# Patient Record
Sex: Male | Born: 1985 | Race: White | Hispanic: No | Marital: Single | Smoking: Current every day smoker
Health system: Southern US, Community
[De-identification: ages and names within clinical notes are randomized; demographics above are authoritative.]

---

## 2019-06-09 ENCOUNTER — Encounter: Payer: Self-pay | Admitting: Emergency Medicine

## 2019-06-09 ENCOUNTER — Emergency Department: Payer: Self-pay

## 2019-06-09 ENCOUNTER — Emergency Department
Admission: EM | Admit: 2019-06-09 | Discharge: 2019-06-09 | Disposition: A | Discharge status: Home / Self Care | Payer: Self-pay | Attending: Emergency Medicine | Admitting: Emergency Medicine

## 2019-06-09 ENCOUNTER — Other Ambulatory Visit: Payer: Self-pay

## 2019-06-09 DIAGNOSIS — R079 Chest pain, unspecified: Secondary | ICD-10-CM | POA: Insufficient documentation

## 2019-06-09 DIAGNOSIS — R112 Nausea with vomiting, unspecified: Secondary | ICD-10-CM | POA: Insufficient documentation

## 2019-06-09 DIAGNOSIS — R197 Diarrhea, unspecified: Secondary | ICD-10-CM | POA: Insufficient documentation

## 2019-06-09 DIAGNOSIS — F17200 Nicotine dependence, unspecified, uncomplicated: Secondary | ICD-10-CM | POA: Insufficient documentation

## 2019-06-09 LAB — COMPREHENSIVE METABOLIC PANEL
ALT: 174 U/L — ABNORMAL HIGH (ref 0–44)
AST: 158 U/L — ABNORMAL HIGH (ref 15–41)
Albumin: 4.5 g/dL (ref 3.5–5.0)
Alkaline Phosphatase: 106 U/L (ref 38–126)
Anion gap: 14 (ref 5–15)
BUN: 12 mg/dL (ref 6–20)
CO2: 22 mmol/L (ref 22–32)
Calcium: 9.4 mg/dL (ref 8.9–10.3)
Chloride: 101 mmol/L (ref 98–111)
Creatinine, Ser: 0.8 mg/dL (ref 0.61–1.24)
GFR calc Af Amer: >60 mL/min (ref >60)
GFR calc non Af Amer: >60 mL/min (ref >60)
Glucose, Bld: 179 mg/dL — ABNORMAL HIGH (ref 70–99)
Potassium: 3.8 mmol/L (ref 3.5–5.1)
Sodium: 137 mmol/L (ref 135–145)
Total Bilirubin: 1.7 mg/dL — ABNORMAL HIGH (ref 0.3–1.2)
Total Protein: 8.3 g/dL — ABNORMAL HIGH (ref 6.5–8.1)

## 2019-06-09 LAB — CBC
HCT: 48.6 % (ref 39.0–52.0)
Hemoglobin: 17.1 g/dL — ABNORMAL HIGH (ref 13.0–17.0)
MCH: 30.1 pg (ref 26.0–34.0)
MCHC: 35.2 g/dL (ref 30.0–36.0)
MCV: 85.6 fL (ref 80.0–100.0)
Platelets: 284 10*3/uL (ref 150–400)
RBC: 5.68 MIL/uL (ref 4.22–5.81)
RDW: 12.3 % (ref 11.5–15.5)
WBC: 8.3 10*3/uL (ref 4.0–10.5)
nRBC: 0 % (ref 0.0–0.2)

## 2019-06-09 LAB — TROPONIN I (HIGH SENSITIVITY)
Troponin I (High Sensitivity): 3 ng/L (ref <18)
Troponin I (High Sensitivity): 3 ng/L (ref <18)

## 2019-06-09 MED ORDER — SODIUM CHLORIDE 0.9 % IV BOLUS
1000.0000 mL | Freq: Once | INTRAVENOUS | Status: AC
  Administered 2019-06-09: 16:00:00 1000 mL via INTRAVENOUS

## 2019-06-09 MED ORDER — ONDANSETRON 4 MG PO TBDP: 4.0000 mg | ORAL_TABLET | Freq: Three times a day (TID) | ORAL | 0 refills | Status: AC | PRN

## 2019-06-09 MED ORDER — IOHEXOL 350 MG/ML SOLN
100.0000 mL | Freq: Once | INTRAVENOUS | Status: AC | PRN
  Administered 2019-06-09: 18:00:00 100 mL via INTRAVENOUS
  Filled 2019-06-09: qty 100

## 2019-06-09 MED ORDER — DIPHENHYDRAMINE HCL 50 MG/ML IJ SOLN
12.5000 mg | Freq: Once | INTRAMUSCULAR | Status: AC
  Administered 2019-06-09: 12.5 mg via INTRAVENOUS
  Filled 2019-06-09: qty 1

## 2019-06-09 MED ORDER — KETOROLAC TROMETHAMINE 30 MG/ML IJ SOLN
15.0000 mg | Freq: Once | INTRAMUSCULAR | Status: AC
  Administered 2019-06-09: 19:00:00 15 mg via INTRAVENOUS
  Filled 2019-06-09: qty 1

## 2019-06-09 MED ORDER — ACETAMINOPHEN 500 MG PO TABS
1000.0000 mg | ORAL_TABLET | Freq: Once | ORAL | Status: AC
  Administered 2019-06-09: 19:00:00 1000 mg via ORAL
  Filled 2019-06-09: qty 2

## 2019-06-09 MED ORDER — SODIUM CHLORIDE 0.9 % IV BOLUS
1000.0000 mL | Freq: Once | INTRAVENOUS | Status: AC
  Administered 2019-06-09: 18:00:00 1000 mL via INTRAVENOUS

## 2019-06-09 NOTE — ED Triage Notes (Signed)
Pt here for NVD since Friday.  Started with chest pain today to left side of chest.  Tachy in triage. Has had syncope when going from sitting to standing.  Reports had covid last month.  Denies urinary sx. No fevers. No abdominal pain.

## 2019-06-09 NOTE — ED Provider Notes (Addendum)
[facility] Emergency Department Provider Note  ____________________________________________   First MD Initiated Contact with Patient 06/09/19 1714     (approximate)  I have reviewed the triage vital signs and the nursing notes.   HISTORY  Chief Complaint Chest Pain and Emesis    HPI Daryl Hoover is a 34 y.o. male who is status post Covid last month who comes in for nausea vomiting diarrhea.  Patient states this has been going on for 3 days, intermittent, worse after eating, nothing makes it better.  Also had some chest pain on the left side.  Patient also reports some shortness of breath.  Patient states this has been going on for the past few days as well.  States that it hurts when he breathes.  Patient is a [employer].  He denies need to be hospitalized for his Covid.  Denies any leg swelling.  He has passed out when he was trying to use the bathroom but he did not hit his head.  He was able to catch himself.  States that he just feels very lightheaded.          History reviewed. No pertinent past medical history.  There are no problems to display for this patient.   History reviewed. No pertinent surgical history.  Prior to Admission medications   Not on File    Allergies Shrimp [shellfish allergy] and Iodinated diagnostic agents  History reviewed. No pertinent family history.  Social History Social History   Tobacco Use  . Smoking status: Current Every Day Smoker  . Smokeless tobacco: Never Used  Substance Use Topics  . Alcohol use: Never  . Drug use: Never      Review of Systems Constitutional: No fever/chills Eyes: No visual changes. ENT: No sore throat. Cardiovascular: Positive chest pain Respiratory: Positive shortness of breath Gastrointestinal: No abdominal pain.  Positive vomiting, diarrhea Genitourinary: Negative for dysuria. Musculoskeletal: Negative for back pain. Skin: Negative for rash. Neurological:  Negative for headaches, focal weakness or numbness. All other ROS negative ____________________________________________   PHYSICAL EXAM:  VITAL SIGNS: ED Triage Vitals  Enc Vitals Group     BP 06/09/19 1515 121/85     Pulse Rate 06/09/19 1515 (!) 132     Resp 06/09/19 1515 (!) 22     Temp 06/09/19 1515 98.7 F (37.1 C)     Temp Source 06/09/19 1515 Oral     SpO2 06/09/19 1515 96 %     Weight 06/09/19 1521 280 lb (127 kg)     Height 06/09/19 1521 5\' 8"  (1.727 m)     Head Circumference --      Peak Flow --      Pain Score 06/09/19 1525 4     Pain Loc --      Pain Edu? --      Excl. in GC? --     Constitutional: Alert and oriented. Well appearing and in no acute distress.  Over weight male Eyes: Conjunctivae are normal. EOMI. Head: Atraumatic. Nose: No congestion/rhinnorhea. Mouth/Throat: Mucous membranes are moist.   Neck: No stridor. Trachea Midline. FROM Cardiovascular: Tachycardic, regular rhythm. Grossly normal heart sounds.  Good peripheral circulation. Respiratory: Normal respiratory effort.  No retractions. Lungs CTAB. Gastrointestinal: Soft and nontender. No distention. No abdominal bruits.  Musculoskeletal: No lower extremity tenderness nor edema.  No joint effusions. Neurologic:  Normal speech and language. No gross focal neurologic deficits are appreciated.  Skin:  Skin is warm, dry and intact. No rash  noted. Psychiatric: Mood and affect are normal. Speech and behavior are normal. GU: Deferred   ____________________________________________   LABS (all labs ordered are listed, but only abnormal results are displayed)  Labs Reviewed  CBC - Abnormal; Notable for the following components:      Result Value   Hemoglobin 17.1 (*)    All other components within normal limits  COMPREHENSIVE METABOLIC PANEL - Abnormal; Notable for the following components:   Glucose, Bld 179 (*)    Total Protein 8.3 (*)    AST 158 (*)    ALT 174 (*)    Total Bilirubin 1.7 (*)     All other components within normal limits  URINALYSIS, COMPLETE (UACMP) WITH MICROSCOPIC  TROPONIN I (HIGH SENSITIVITY)  TROPONIN I (HIGH SENSITIVITY)   ____________________________________________   ED ECG REPORT I, Vanessa [place], the attending physician, personally viewed and interpreted this ECG.  EKG is sinus tachycardia with S1Q3T3, no ST elevation, otherwise normal intervals ____________________________________________  RADIOLOGY Robert Bellow, personally viewed and evaluated these images (plain radiographs) as part of my medical decision making, as well as reviewing the written report by the radiologist.  ED MD interpretation:   No pna   Official radiology report(s): DG Chest 2 View  Result Date: 06/09/2019 CLINICAL DATA:  Chest pain. Nausea and vomiting for 3 days. Tachycardia. Report of COVID-19 last month. EXAM: CHEST - 2 VIEW COMPARISON:  None. FINDINGS: Lung volumes are low. Minimal airspace opacity is present at the left base. The lungs are otherwise clear. No edema or effusion is present. The visualized soft tissues and bony thorax are unremarkable. IMPRESSION: 1. Minimal airspace disease at the left base likely reflects atelectasis. 2. No other acute abnormality. 3. Low lung volumes. Electronically Signed   By: San Morelle M.D.   On: 06/09/2019 16:25   CT Angio Chest PE W and/or Wo Contrast  Result Date: 06/09/2019 CLINICAL DATA:  Shortness of breath, left-sided chest pain for 1 day, tachycardia, syncope EXAM: CT ANGIOGRAPHY CHEST WITH CONTRAST TECHNIQUE: Multidetector CT imaging of the chest was performed using the standard protocol during bolus administration of intravenous contrast. Multiplanar CT image reconstructions and MIPs were obtained to evaluate the vascular anatomy. CONTRAST:  127mL OMNIPAQUE IOHEXOL 350 MG/ML SOLN COMPARISON:  06/09/2019 FINDINGS: Cardiovascular: This is a technically adequate evaluation of the pulmonary vasculature. No filling  defects or pulmonary emboli. Heart is unremarkable without pericardial effusion. Thoracic aorta is normal. Mediastinum/Nodes: No enlarged mediastinal, hilar, or axillary lymph nodes. Thyroid gland, trachea, and esophagus demonstrate no significant findings. Lungs/Pleura: Hypoventilatory changes at the lung bases. No airspace disease, effusion, or pneumothorax. Central airways are patent. Upper Abdomen: Diffuse fatty infiltration of the liver. No acute findings. Musculoskeletal: No acute or destructive bony lesions. Reconstructed images demonstrate no additional findings. Review of the MIP images confirms the above findings. IMPRESSION: 1. No evidence of pulmonary embolus. 2. Hypoventilatory changes, no acute intrathoracic process. 3. Fatty liver. Electronically Signed   By: Randa Ngo M.D.   On: 06/09/2019 18:05   CT ABDOMEN PELVIS W CONTRAST  Result Date: 06/09/2019 CLINICAL DATA:  Nausea/vomiting/diarrhea, left-sided chest pain, tachycardia EXAM: CT ABDOMEN AND PELVIS WITH CONTRAST TECHNIQUE: Multidetector CT imaging of the abdomen and pelvis was performed using the standard protocol following bolus administration of intravenous contrast. CONTRAST:  145mL OMNIPAQUE IOHEXOL 350 MG/ML SOLN COMPARISON:  None. FINDINGS: Lower chest: No acute pleural or parenchymal lung disease. Hepatobiliary: Diffuse fatty infiltration of the liver. No focal abnormalities. The gallbladder is  unremarkable. Pancreas: Unremarkable. No pancreatic ductal dilatation or surrounding inflammatory changes. Spleen: Normal in size without focal abnormality. Adrenals/Urinary Tract: 4 mm nonobstructing left renal calculus reference image 45. Kidneys enhance normally. Bladder is unremarkable. Adrenals are normal. Stomach/Bowel: No bowel obstruction or ileus. Normal appendix right lower quadrant. No wall thickening or inflammatory changes. Vascular/Lymphatic: No significant vascular findings are present. No enlarged abdominal or pelvic lymph  nodes. Reproductive: Prostate is unremarkable. Other: No abdominal wall hernia or abnormality. No abdominopelvic ascites. Musculoskeletal: No acute or destructive bony lesions. Reconstructed images demonstrate no additional findings. IMPRESSION: 1. Diffuse fatty infiltration of the liver. 2. 4 mm nonobstructing left renal calculus. Electronically Signed   By: Sharlet Salina M.D.   On: 06/09/2019 18:08    ____________________________________________   PROCEDURES  Procedure(s) performed (including Critical Care):  Procedures   ____________________________________________   INITIAL IMPRESSION / ASSESSMENT AND PLAN / ED COURSE  Sani Madariaga was evaluated in Emergency Department on 06/09/2019 for the symptoms described in the history of present illness. He was evaluated in the context of the global COVID-19 pandemic, which necessitated consideration that the patient might be at risk for infection with the SARS-CoV-2 virus that causes COVID-19. Institutional protocols and algorithms that pertain to the evaluation of patients at risk for COVID-19 are in a state of rapid change based on information released by regulatory bodies including the CDC and federal and state organizations. These policies and algorithms were followed during the patient's care in the ED.    Patient is a 34 year old who comes in tachycardic with increased respiratory rate and has multiple risk factors for Covid.  Labs were ordered to evaluate for electrolyte abnormalities, AKI.  Those were reassuring.  Will do CT scan to make sure no evidence of PE.  Given the vomiting after eating we will do CT abdomen to make sure no evidence of obstruction, gallbladder pathology or other acute concerns.  cardiac markers are negative x2.  Do not think this is ACS  Patient CT PE negative and CT abdomen are negative.  Patient does have fatty liver on his CT scan.  Patient states that he has had some abnormal LFTs in the past.  Gallbladder  looks normal patient has no right upper quadrant tenderness to suggest gallbladder pathology.  Patient provided a copy of his report told to follow-up with PCP for his liver.  To note patient reported an allergy to iodine but he had never had iodine before.  He was just told that if he was allergic to shrimp that he could also be allergic to iodine.  I did confirm with CT that this is an old wives tail and that they can proceed with the iodine.  Afterwards patient had one spot of itching underneath his right leg.  I do not think this is a reaction from the iodine but patient was given some Benadryl for the itching   Patient heart rate has come down to less than 100.  Remains afebrile and oxygen levels are reassuring.  Patient's respiratory rate slightly elevated but does not appear to be any acute distress when I evaluate him and when I am talking to him.  He is speaking in full sentences and his lungs sound clear.  7:35 PM reevaluated patient he states that he is feeling much better.  Patient feels comfortable going home at this time.  We discussed that if his shortness of breath gets worse he can return the ER for further evaluation.  I suspect that most likely  patient was severely dehydrated from the nausea vomiting diarrhea.  Patient denies any urinary symptoms therefore does not want to have a UA done   ________________________________________   FINAL CLINICAL IMPRESSION(S) / ED DIAGNOSES   Final diagnoses:  Nausea vomiting and diarrhea  Chest pain, unspecified type      MEDICATIONS GIVEN DURING THIS VISIT:  Medications  sodium chloride 0.9 % bolus 1,000 mL (0 mLs Intravenous Stopped 06/09/19 1802)  iohexol (OMNIPAQUE) 350 MG/ML injection 100 mL (100 mLs Intravenous Contrast Given 06/09/19 1738)  diphenhydrAMINE (BENADRYL) injection 12.5 mg (12.5 mg Intravenous Given 06/09/19 1808)  sodium chloride 0.9 % bolus 1,000 mL (1,000 mLs Intravenous New Bag/Given 06/09/19 1821)    acetaminophen (TYLENOL) tablet 1,000 mg (1,000 mg Oral Given 06/09/19 1928)  ketorolac (TORADOL) 30 MG/ML injection 15 mg (15 mg Intravenous Given 06/09/19 1928)     ED Discharge Orders         Ordered    ondansetron (ZOFRAN ODT) 4 MG disintegrating tablet  Every 8 hours PRN     06/09/19 1936           Note:  This document was prepared using Dragon voice recognition software and may include unintentional dictation errors.   Concha Se, MD 06/09/19 Aretha Parrot    Concha Se, MD 06/09/19 [phone]

## 2019-06-09 NOTE — Discharge Instructions (Addendum)
Your liver function tests were slightly elevated can be followed up with your primary doctor.  We prescribed you some Zofran to help with nausea.  Continue take plenty of fluids.  You most likely passed out and had a elevated heart rate due to being severely dehydrated.  Return to the ER if you develop worsening shortness of breath or any other concerns   IMPRESSION:  1. Diffuse fatty infiltration of the liver.  2. 4 mm nonobstructing left renal calculus.   IMPRESSION: 1. No evidence of pulmonary embolus. 2. Hypoventilatory changes, no acute intrathoracic process. 3. Fatty liver.

## 2019-06-09 NOTE — ED Notes (Signed)
Discharge signature on paper. See attached chart documents

## 2021-09-23 IMAGING — CT CT ABD-PELV W/ CM
2 of 4 series · 17 of 46 positions shown, 19 images · IV contrast (APPLIED)
Comparison: None.

CLINICAL DATA: Nausea/vomiting/diarrhea, left-sided chest pain,
tachycardia

EXAM:
CT ABDOMEN AND PELVIS WITH CONTRAST
TECHNIQUE: Multidetector CT imaging of the abdomen and pelvis was performed
using the standard protocol following bolus administration of
intravenous contrast.
CONTRAST:  100mL OMNIPAQUE IOHEXOL 350 MG/ML SOLN

[Series 4: axial st · axial · 0.93mm/px · z∈[-741,-261]mm · 14 of 106 slices shown, 16 images]
[im 5/106  soft-tissue]
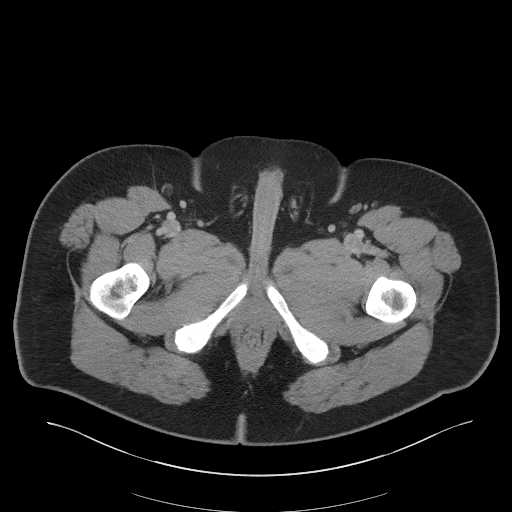
[im 5/106  bone]
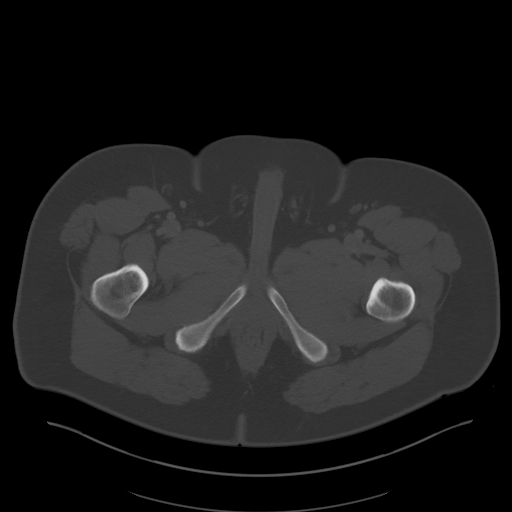
[im 15/106  soft-tissue]
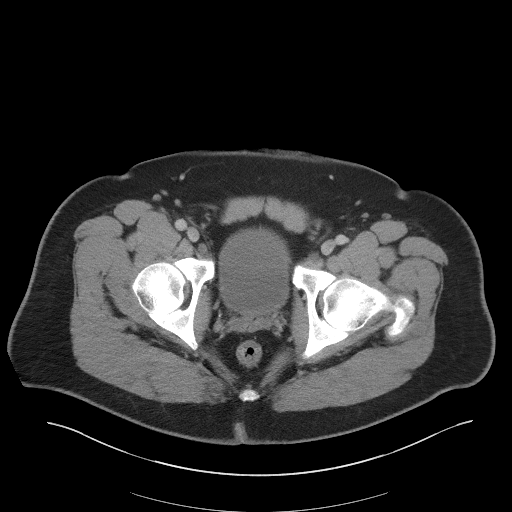
[im 20/106  soft-tissue]
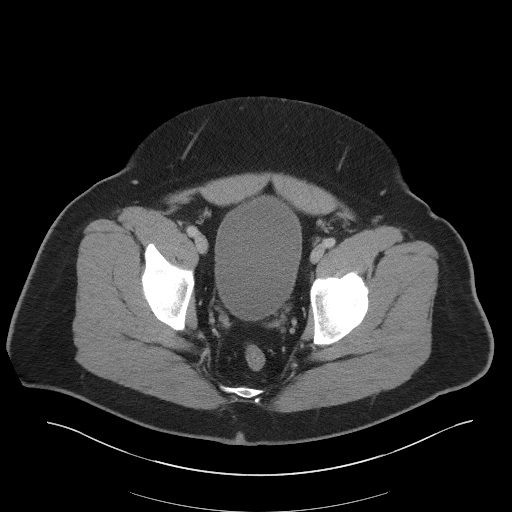
[im 29/106  soft-tissue]
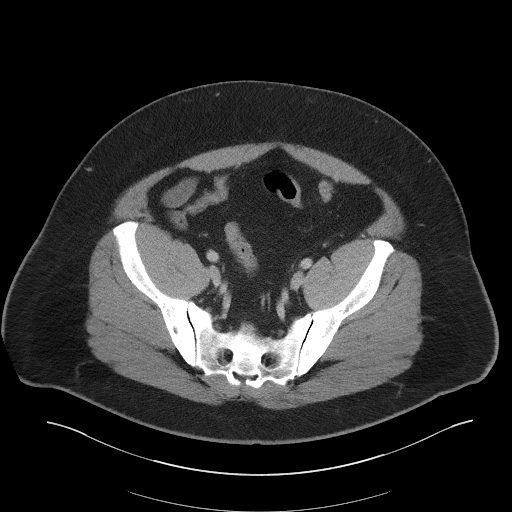
[im 34/106  soft-tissue]
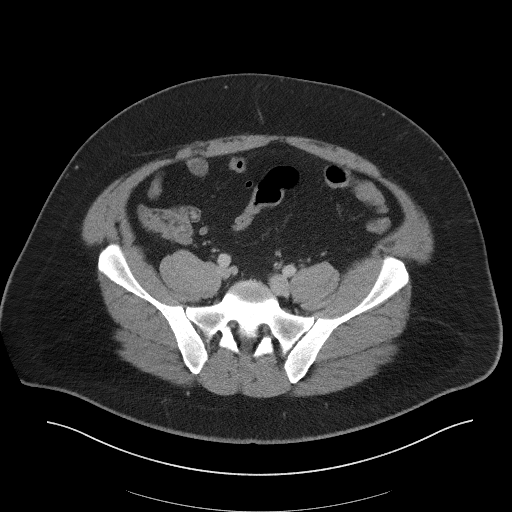
[im 43/106  soft-tissue]
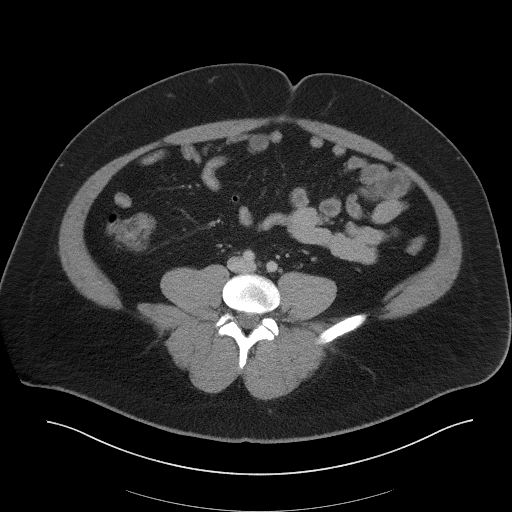
[im 48/106  soft-tissue]
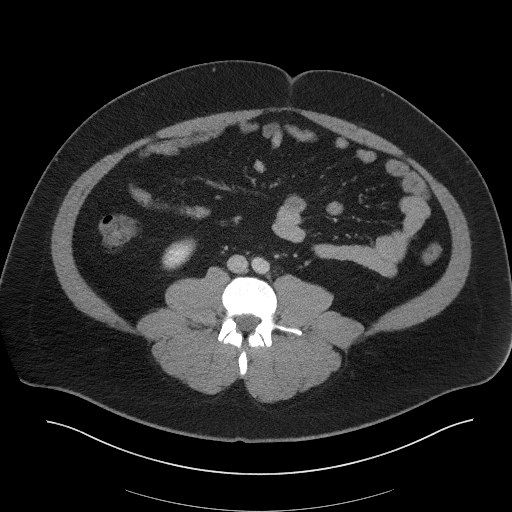
[im 58/106  soft-tissue]
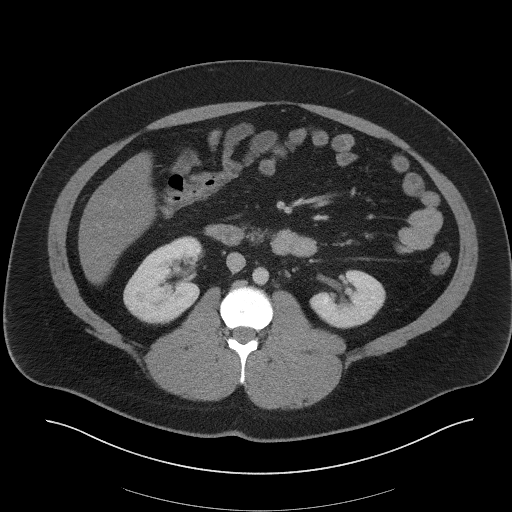
[im 63/106  soft-tissue]
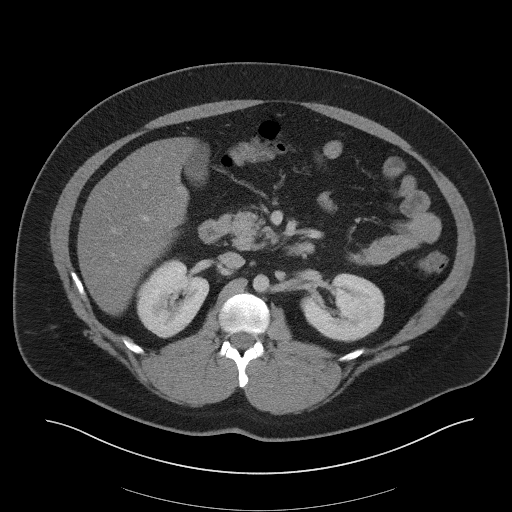
[im 63/106  bone]
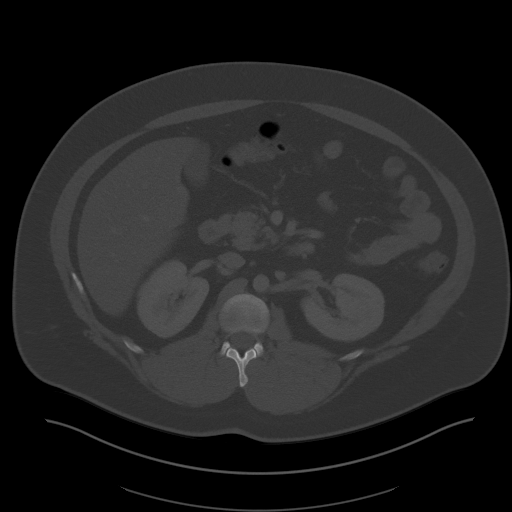
[im 72/106  soft-tissue]
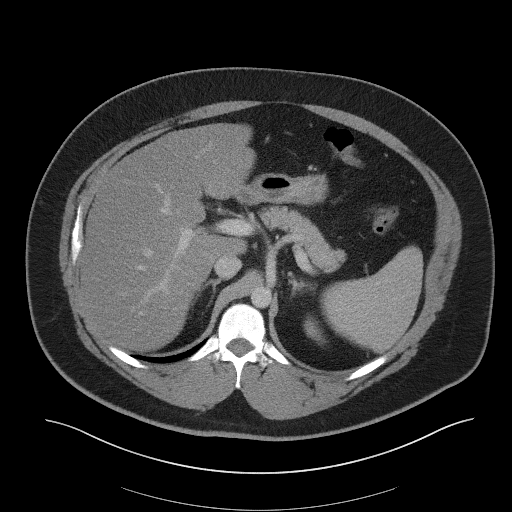
[im 77/106  soft-tissue]
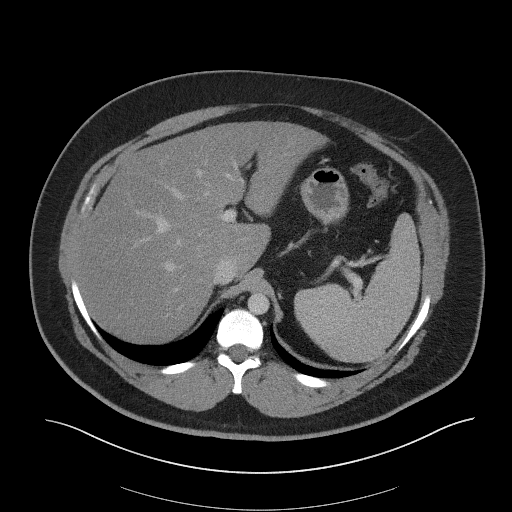
[im 86/106  soft-tissue]
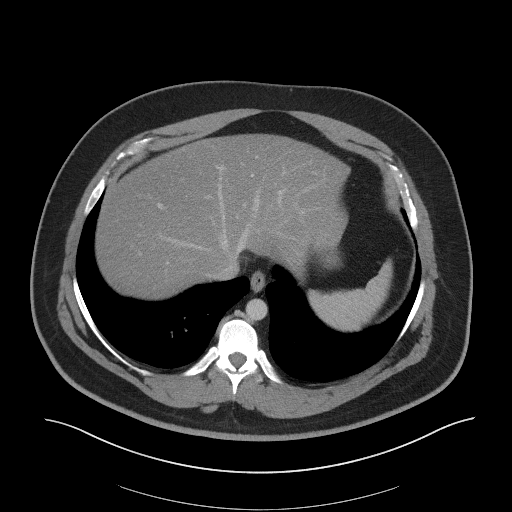
[im 91/106  soft-tissue]
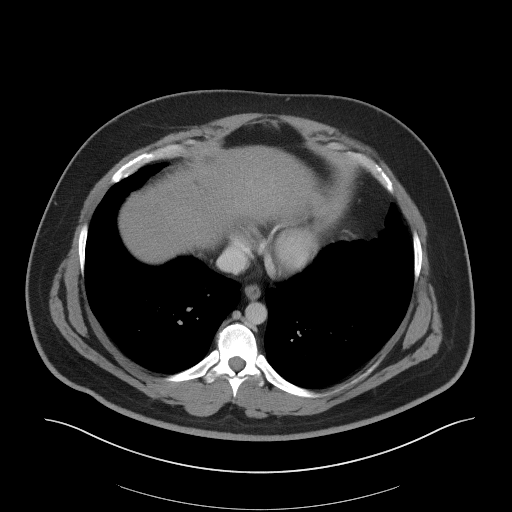
[im 101/106  soft-tissue]
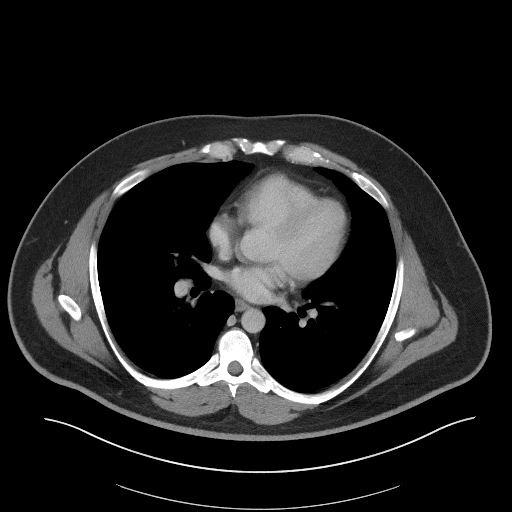

[Series 7: coronal st · coronal · 0.95mm/px · 3 of 120 slices shown]
[im 40/120  soft-tissue]
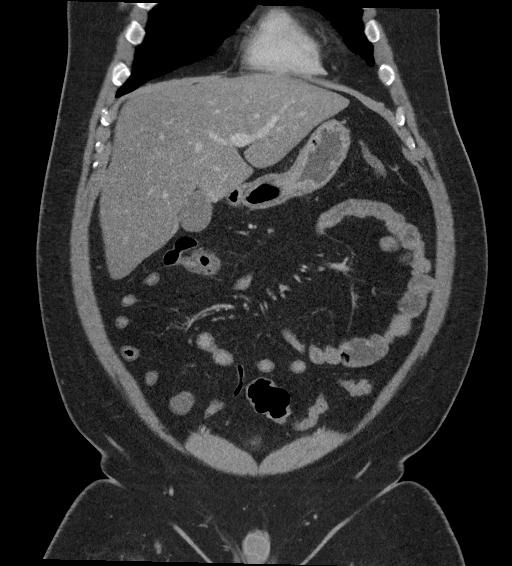
[im 53/120  soft-tissue]
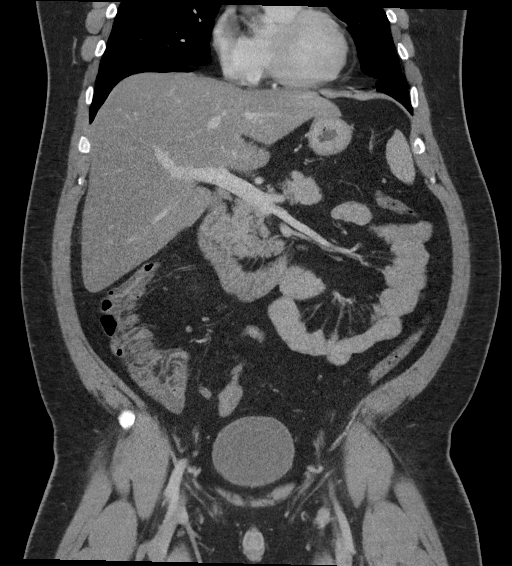
[im 67/120  soft-tissue]
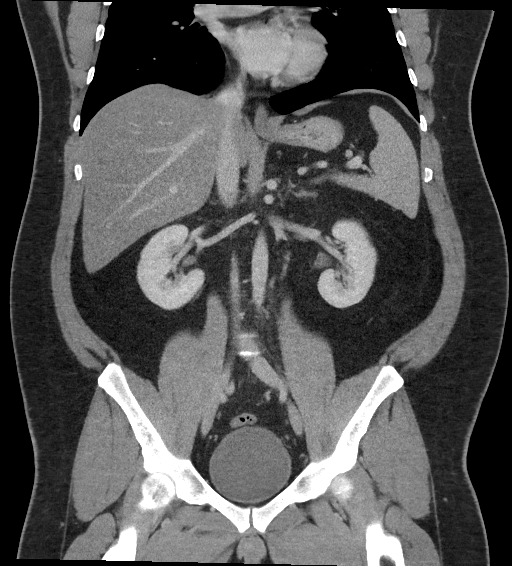

[17 of 46 positions shown; findings below may reference images not displayed]

FINDINGS: Lower chest: No acute pleural or parenchymal lung disease.

Hepatobiliary: Diffuse fatty infiltration of the liver. No focal
abnormalities. The gallbladder is unremarkable.

Pancreas: Unremarkable. No pancreatic ductal dilatation or
surrounding inflammatory changes.

Spleen: Normal in size without focal abnormality.

Adrenals/Urinary Tract: 4 mm nonobstructing left renal calculus
reference image 45. Kidneys enhance normally. Bladder is
unremarkable. Adrenals are normal.

Stomach/Bowel: No bowel obstruction or ileus. Normal appendix right
lower quadrant. No wall thickening or inflammatory changes.

Vascular/Lymphatic: No significant vascular findings are present. No
enlarged abdominal or pelvic lymph nodes.

Reproductive: Prostate is unremarkable.

Other: No abdominal wall hernia or abnormality. No abdominopelvic
ascites.

Musculoskeletal: No acute or destructive bony lesions. Reconstructed
images demonstrate no additional findings.
IMPRESSION: 1. Diffuse fatty infiltration of the liver.
2. 4 mm nonobstructing left renal calculus.
